# Patient Record
Sex: Male | Born: 1985 | Race: Black or African American | Hispanic: No | Marital: Single | Smoking: Never smoker
Health system: Southern US, Community
[De-identification: ages and names within clinical notes are randomized; demographics above are authoritative.]

## PROBLEM LIST (undated history)

## (undated) HISTORY — PX: BRAIN TUMOR EXCISION: SHX577

---

## 2016-11-07 ENCOUNTER — Encounter: Payer: Self-pay | Admitting: Emergency Medicine

## 2016-11-07 DIAGNOSIS — G4489 Other headache syndrome: Secondary | ICD-10-CM | POA: Insufficient documentation

## 2016-11-07 DIAGNOSIS — Z86011 Personal history of benign neoplasm of the brain: Secondary | ICD-10-CM | POA: Diagnosis not present

## 2016-11-07 DIAGNOSIS — R51 Headache: Secondary | ICD-10-CM | POA: Diagnosis present

## 2016-11-07 NOTE — ED Triage Notes (Signed)
Patient ambulatory to triage with steady gait, without difficulty or distress noted; pt reports last several days having pain to right side of head; denies hx of same; denies accomp symptoms

## 2016-11-08 ENCOUNTER — Emergency Department
Admission: EM | Admit: 2016-11-08 | Discharge: 2016-11-08 | Disposition: A | Discharge status: Home / Self Care | Payer: BLUE CROSS/BLUE SHIELD | Attending: Emergency Medicine | Admitting: Emergency Medicine

## 2016-11-08 ENCOUNTER — Emergency Department: Payer: BLUE CROSS/BLUE SHIELD

## 2016-11-08 DIAGNOSIS — R51 Headache: Secondary | ICD-10-CM

## 2016-11-08 DIAGNOSIS — R519 Headache, unspecified: Secondary | ICD-10-CM

## 2016-11-08 MED ORDER — OXYCODONE-ACETAMINOPHEN 5-325 MG PO TABS
1.0000 | ORAL_TABLET | Freq: Once | ORAL | Status: AC
  Administered 2016-11-08: 1 via ORAL
  Filled 2016-11-08 (×2): qty 1

## 2016-11-08 NOTE — ED Notes (Signed)

## 2016-11-08 NOTE — ED Notes (Signed)
ED Provider at bedside. 

## 2016-11-08 NOTE — ED Provider Notes (Signed)
[facility] Emergency Department Provider Note    First MD Initiated Contact with Patient 11/08/16 [phone]     (approximate)  I have reviewed the triage vital signs and the nursing notes.   HISTORY  Chief Complaint Headache   HPI Billy Kirk is a 31 y.o. male presents to the emergency department with bitemporal headache that is currently 5 out of 10. Patient states that he's had headaches over the past 3 days. Patient denies any weakness numbness gait instability or visual changes. Patient denies any nausea or vomiting. Patient denies any photophobia or phonophobia. Patient denied any neck pain or stiffness. Patient denies any fever   Past medical history "Brain tumor" There are no active problems to display for this patient.   Past Surgical History:  Procedure Laterality Date  . BRAIN TUMOR EXCISION      Prior to Admission medications   Not on File    Allergies No known drug allergies No family history on file.  Social History Social History  Substance Use Topics  . Smoking status: Never Smoker  . Smokeless tobacco: Never Used  . Alcohol use No    Review of Systems Constitutional: No fever/chills Eyes: No visual changes. ENT: No sore throat. Cardiovascular: Denies chest pain. Respiratory: Denies shortness of breath. Gastrointestinal: No abdominal pain.  No nausea, no vomiting.  No diarrhea.  No constipation. Genitourinary: Negative for dysuria. Musculoskeletal: Negative for neck pain.  Negative for back pain. Integumentary: Negative for rash. Neurological: Positive for headaches, negative for focal weakness or numbness.   ____________________________________________   PHYSICAL EXAM:  VITAL SIGNS: ED Triage Vitals [11/07/16 2323]  Enc Vitals Group     BP (!) 152/87     Pulse Rate 88     Resp 18     Temp 98 F (36.7 C)     Temp Source Oral     SpO2 100 %     Weight 95.3 kg (210 lb)     Height 1.651 m (5\' 5" )     Head  Circumference      Peak Flow      Pain Score 8     Pain Loc      Pain Edu?      Excl. in GC?     Constitutional: Alert and oriented. Well appearing and in no acute distress. Eyes: Conjunctivae are normal. PERRL. EOMI. Head: Atraumatic. Mouth/Throat: Mucous membranes are moist. Neck: No stridor.   Cardiovascular: Normal rate, regular rhythm. Good peripheral circulation. Grossly normal heart sounds. Respiratory: Normal respiratory effort.  No retractions. Lungs CTAB. Gastrointestinal: Soft and nontender. No distention.  Musculoskeletal: No lower extremity tenderness nor edema. No gross deformities of extremities. Neurologic:  Normal speech and language. No gross focal neurologic deficits are appreciated.  Skin:  Skin is warm, dry and intact. No rash noted. Psychiatric: Mood and affect are normal. Speech and behavior are normal.  ____________________________________________    RADIOLOGY I, Manson N BROWN, personally viewed and evaluated these images (plain radiographs) as part of my medical decision making, as well as reviewing the written report by the radiologist.  Ct Head Wo Contrast  Result Date: 11/08/2016 CLINICAL DATA:  Headache. 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 EXAM: CT HEAD WITHOUT CONTRAST TECHNIQUE: Contiguous axial images were obtained from the base of the skull through the vertex without intravenous contrast. COMPARISON:  None available. Report from head CT 02/23/2012 at an outside institution. FINDINGS: Brain: Mild lateral ventricular prominence for age. No  periventricular edema. Third and fourth ventricle are decompressed. The basilar cisterns are patent. No intracranial hemorrhage, mass effect, or midline shift. No evidence of mass lesion. No evidence of territorial infarct or acute ischemia. No extra-axial or intracranial fluid collection. Vascular: No hyperdense vessel or unexpected calcification. Skull: Bifrontal craniotomy.   No focal lesion or fracture. Sinuses/Orbits: Paranasal sinuses and mastoid air cells are clear. The visualized orbits are unremarkable. Other: None. IMPRESSION: 1. Mild lateral ventricular prominence for age, likely chronic. No acute intracranial abnormality. 2. Previous bifrontal craniotomy. Electronically Signed   By: Rubye Oaks M.D.   On: 11/08/2016 01:56     Procedures   ____________________________________________   INITIAL IMPRESSION / ASSESSMENT AND PLAN / ED COURSE  Pertinent labs & imaging results that were available during my care of the patient were reviewed by me and considered in my medical decision making (see chart for details).  31 year old male presenting with headache with no focal neurological deficits. CT scan of the head revealed no acute intracranial abnormality.      ____________________________________________  FINAL CLINICAL IMPRESSION(S) / ED DIAGNOSES  Final diagnoses:  Acute nonintractable headache, unspecified headache type     MEDICATIONS GIVEN DURING THIS VISIT:  Medications  oxyCODONE-acetaminophen (PERCOCET/ROXICET) 5-325 MG per tablet 1 tablet (1 tablet Oral Given 11/08/16 0212)     NEW OUTPATIENT MEDICATIONS STARTED DURING THIS VISIT:  New Prescriptions   No medications on file    Modified Medications   No medications on file    Discontinued Medications   No medications on file     Note:  This document was prepared using Dragon voice recognition software and may include unintentional dictation errors.    Darci Current, MD 11/08/16 [phone]

## 2017-10-04 IMAGING — CT CT HEAD W/O CM
3 series · 16 of 46 positions shown, 19 images · non-contrast
Comparison: None available. Report from head CT 02/23/2012 at an
outside institution.

CLINICAL DATA: Headache. History of brain tumor resection (outside
records indicate craniopharyngioma).

EXAM:
CT HEAD WITHOUT CONTRAST
TECHNIQUE: Contiguous axial images were obtained from the base of the skull
through the vertex without intravenous contrast.

[Series 3: head wo · axial · 0.43mm/px · z∈[-86,+34]mm · 10 of 29 slices shown, 13 images]
[im 3/29  brain]
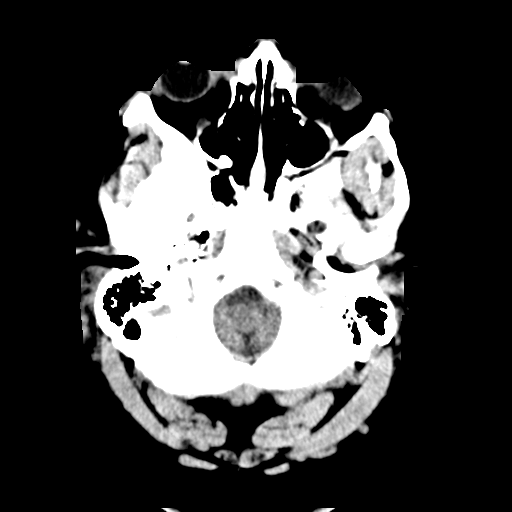
[im 3/29  bone]
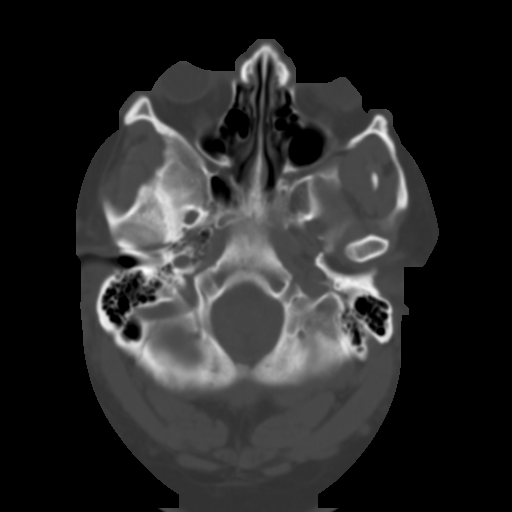
[im 6/29  brain]
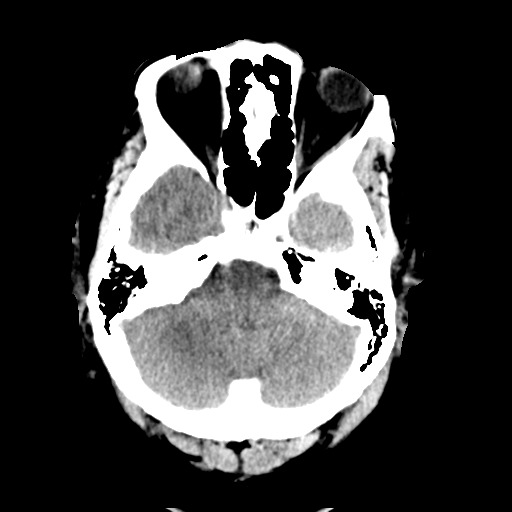
[im 8/29  brain]
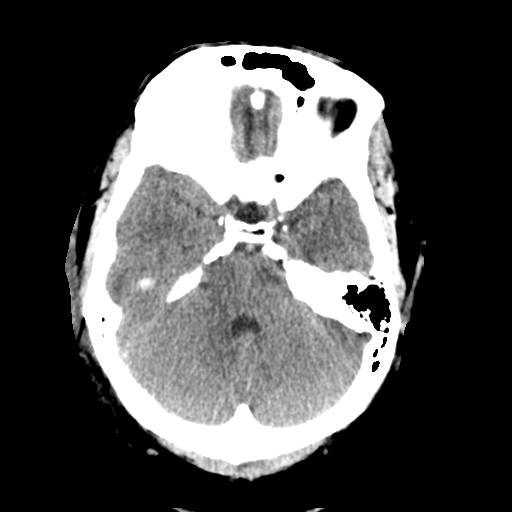
[im 11/29  brain]
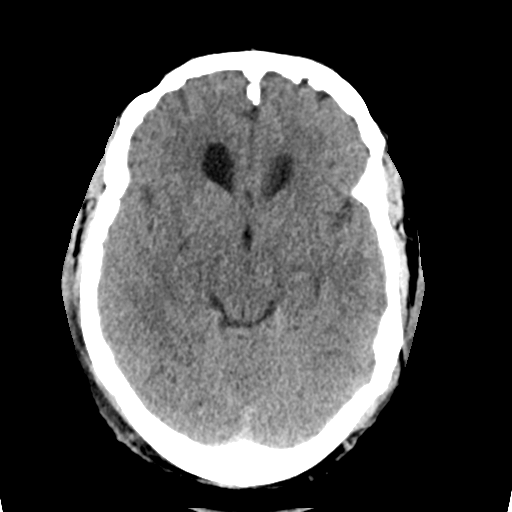
[im 14/29  brain]
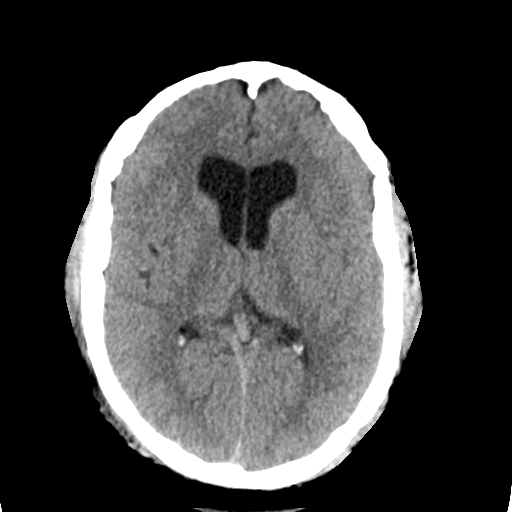
[im 14/29  bone]
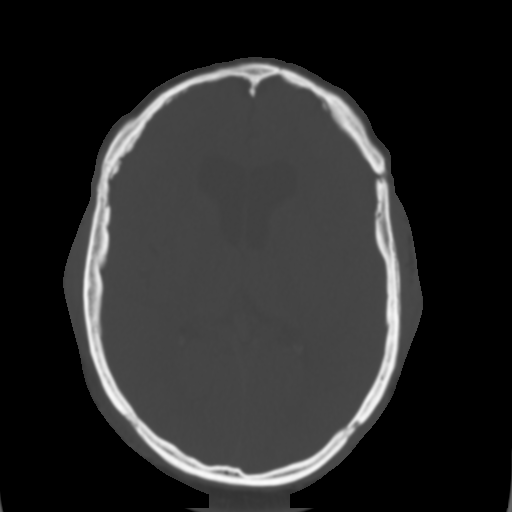
[im 16/29  brain]
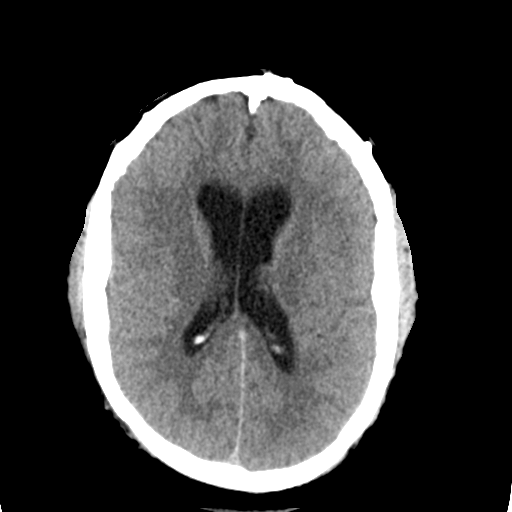
[im 19/29  brain]
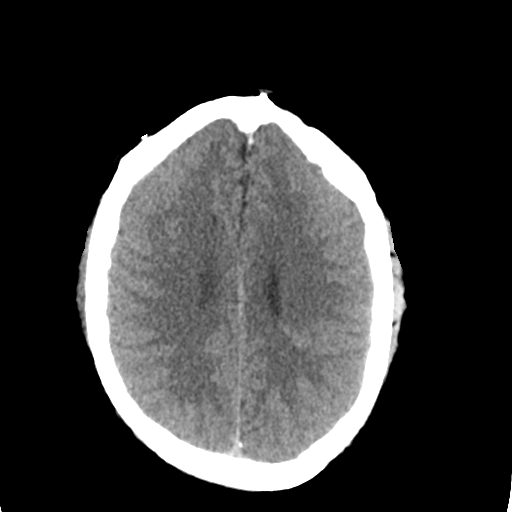
[im 22/29  brain]
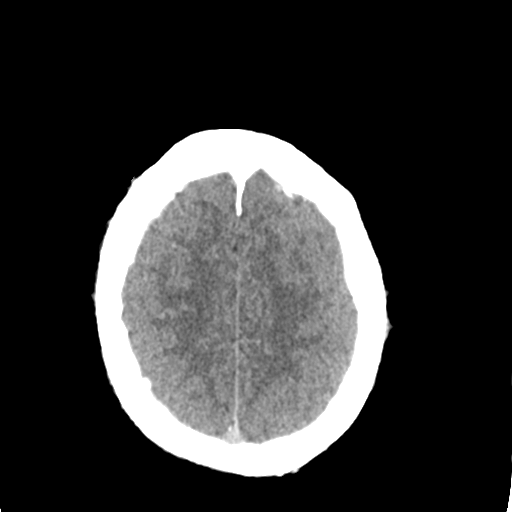
[im 24/29  brain]
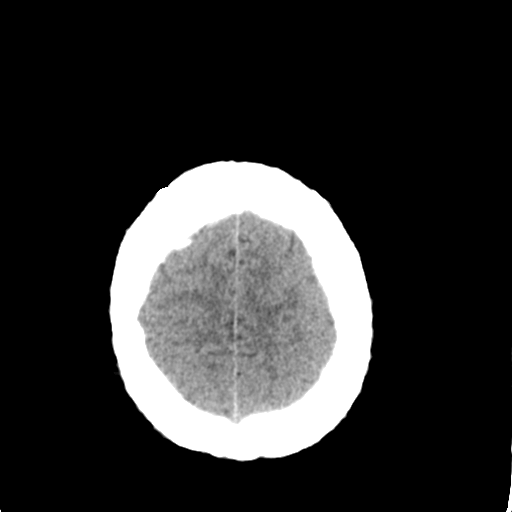
[im 24/29  bone]
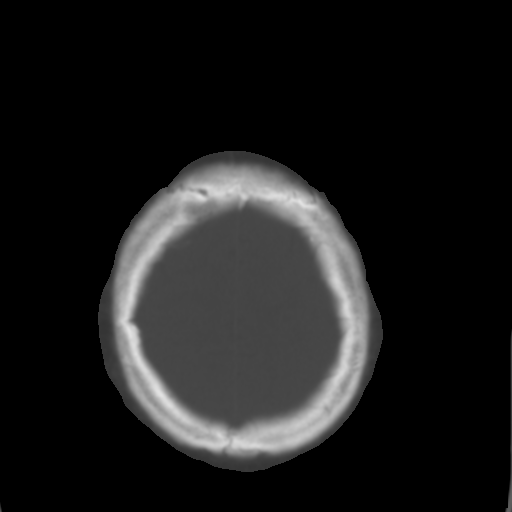
[im 27/29  brain]
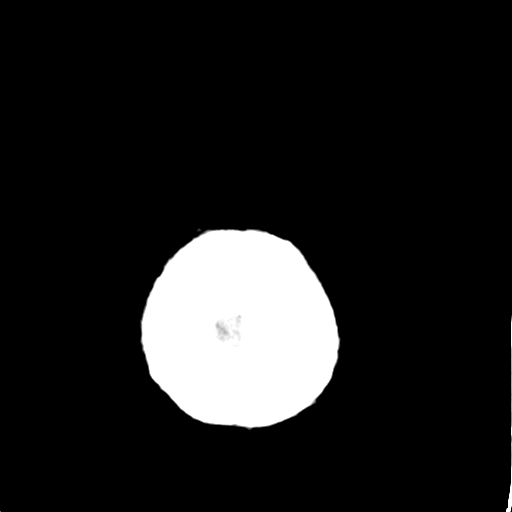

[Series 4: coronal soft tissue · coronal · 0.30mm/px · 3 of 62 slices shown]
[im 21/62  brain]
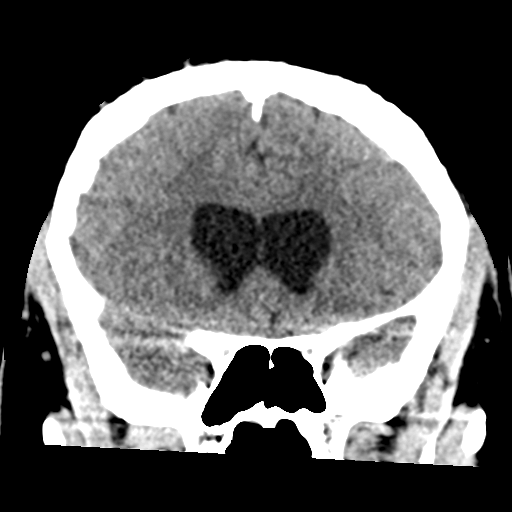
[im 28/62  brain]
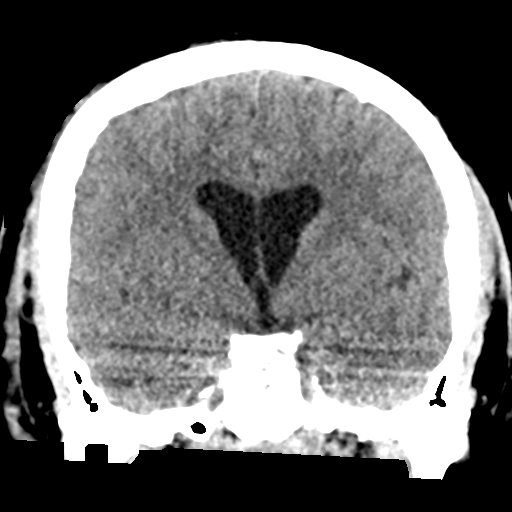
[im 34/62  brain]
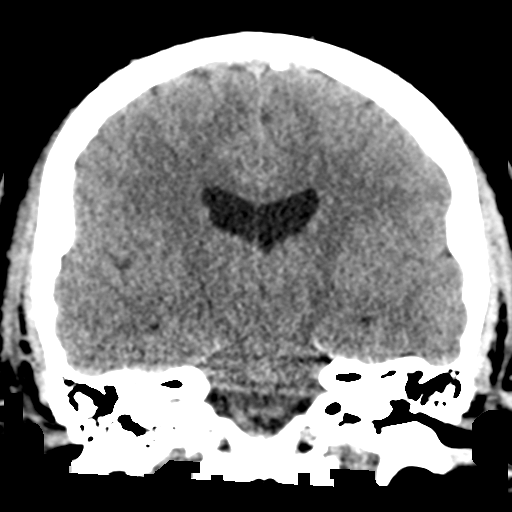

[Series 5: sagittal soft tissue · sagittal · 0.31mm/px · 3 of 57 slices shown]
[im 19/57  brain]
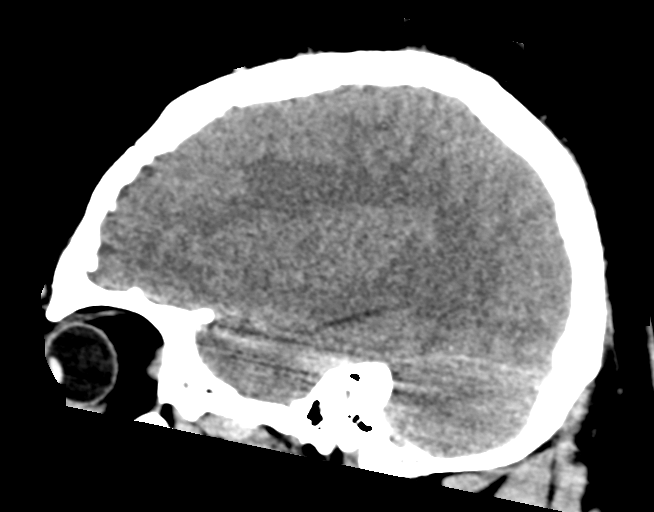
[im 29/57  brain]
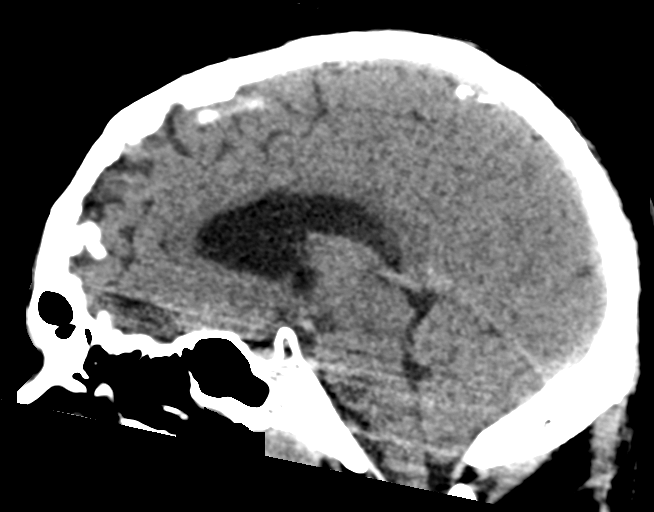
[im 38/57  brain]
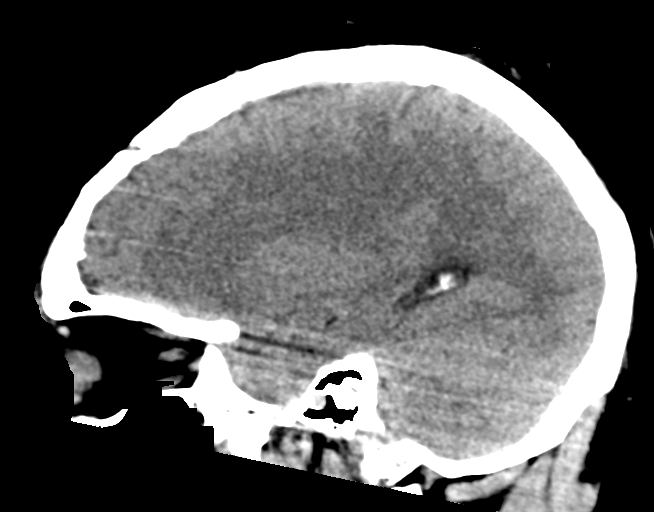

[16 of 46 positions shown; findings below may reference images not displayed]

FINDINGS: Brain: Mild lateral ventricular prominence for age. No
periventricular edema. Third and fourth ventricle are decompressed.
The basilar cisterns are patent. No intracranial hemorrhage, mass
effect, or midline shift. No evidence of mass lesion. No evidence of
territorial infarct or acute ischemia. No extra-axial or
intracranial fluid collection.

Vascular: No hyperdense vessel or unexpected calcification.

Skull: Bifrontal craniotomy.  No focal lesion or fracture.

Sinuses/Orbits: Paranasal sinuses and mastoid air cells are clear.
The visualized orbits are unremarkable.

Other: None.
IMPRESSION: 1. Mild lateral ventricular prominence for age, likely chronic. No
acute intracranial abnormality.
2. Previous bifrontal craniotomy.
